# Patient Record
Sex: Male | Born: 1990 | Race: White | Hispanic: No | Marital: Single | Smoking: Never smoker
Health system: Southern US, Community
[De-identification: ages and names within clinical notes are randomized; demographics above are authoritative.]

---
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
## 2010-10-03 ENCOUNTER — Inpatient Hospital Stay (INDEPENDENT_AMBULATORY_CARE_PROVIDER_SITE_OTHER)
Admission: RE | Admit: 2010-10-03 | Discharge: 2010-10-03 | Disposition: A | Discharge status: Home / Self Care | Payer: Self-pay | Source: Ambulatory Visit | Attending: Family Medicine | Admitting: Family Medicine

## 2010-10-03 DIAGNOSIS — H9209 Otalgia, unspecified ear: Secondary | ICD-10-CM

## 2013-10-04 ENCOUNTER — Emergency Department (HOSPITAL_COMMUNITY): Payer: Self-pay

## 2013-10-04 ENCOUNTER — Emergency Department (HOSPITAL_COMMUNITY)
Admission: EM | Admit: 2013-10-04 | Discharge: 2013-10-04 | Disposition: A | Discharge status: Home / Self Care | Payer: Self-pay | Attending: Emergency Medicine | Admitting: Emergency Medicine

## 2013-10-04 ENCOUNTER — Encounter (HOSPITAL_COMMUNITY): Payer: Self-pay | Admitting: Emergency Medicine

## 2013-10-04 DIAGNOSIS — Y929 Unspecified place or not applicable: Secondary | ICD-10-CM | POA: Insufficient documentation

## 2013-10-04 DIAGNOSIS — X58XXXA Exposure to other specified factors, initial encounter: Secondary | ICD-10-CM | POA: Insufficient documentation

## 2013-10-04 DIAGNOSIS — Y9389 Activity, other specified: Secondary | ICD-10-CM | POA: Insufficient documentation

## 2013-10-04 DIAGNOSIS — S61209A Unspecified open wound of unspecified finger without damage to nail, initial encounter: Secondary | ICD-10-CM | POA: Insufficient documentation

## 2013-10-04 DIAGNOSIS — F17200 Nicotine dependence, unspecified, uncomplicated: Secondary | ICD-10-CM | POA: Insufficient documentation

## 2013-10-04 DIAGNOSIS — IMO0002 Reserved for concepts with insufficient information to code with codable children: Secondary | ICD-10-CM

## 2013-10-04 DIAGNOSIS — Z23 Encounter for immunization: Secondary | ICD-10-CM | POA: Insufficient documentation

## 2013-10-04 MED ORDER — HYDROCODONE-ACETAMINOPHEN 5-325 MG PO TABS: 1.0000 | ORAL_TABLET | Freq: Four times a day (QID) | ORAL | Status: DC | PRN

## 2013-10-04 MED ORDER — TETANUS-DIPHTH-ACELL PERTUSSIS 5-2.5-18.5 LF-MCG/0.5 IM SUSP
0.5000 mL | Freq: Once | INTRAMUSCULAR | Status: AC
  Administered 2013-10-04: 0.5 mL via INTRAMUSCULAR
  Filled 2013-10-04: qty 0.5

## 2013-10-04 MED ORDER — CEPHALEXIN 250 MG PO CAPS: 250.0000 mg | ORAL_CAPSULE | Freq: Four times a day (QID) | ORAL | Status: DC

## 2013-10-04 MED ORDER — ACETAMINOPHEN 325 MG PO TABS: 650.0000 mg | ORAL_TABLET | Freq: Once | ORAL | Status: DC

## 2013-10-04 NOTE — ED Provider Notes (Signed)
CSN: [id number]     Arrival date & time 10/04/13  1351 History  This chart was scribed for non-physician practitioner working with Shanna CiscoMegan E Docherty, MD, by Allen Foster, ED Scribe. This patient was seen in room [facility] and the patient's care was started at 5:52 PM.   First MD Initiated Contact with Patient 10/04/13 1733     Chief Complaint  Patient presents with  . Finger Injury    right thumb    The history is provided by the patient. No language interpreter was used.   HPI Comments: Allen Foster is a 23 y.o. male who presents to the Emergency Department complaining of a laceration to his right thumb which occurred today as he was slicing sheet metal. The site bled immediately, but the bleeding is controlled in the ED. Mr. 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 He denies paresthesia or weakness to the finger. The pt is unsure re the status of his tetanus.   History reviewed. No pertinent past medical history. History reviewed. No pertinent past surgical history. No family history on file. History  Substance Use Topics  . Smoking status: Current Every Day Smoker  . Smokeless tobacco: Not on file  . Alcohol Use: Yes    Review of Systems  Skin: Positive for wound.  Neurological: Positive for numbness. Negative for weakness.  All other systems reviewed and are negative.   Allergies  Review of patient's allergies indicates no known allergies.  Home Medications   Prior to Admission medications   Medication Sig Start Date End Date Taking? Authorizing Provider  Multiple Vitamin (MULTIVITAMIN WITH MINERALS) TABS tablet Take 1 tablet by mouth every morning.   Yes Historical Provider, MD   Triage Vitals: BP 137/96  Pulse 100  Temp(Src) 98.4 F (36.9 C) (Oral)  Resp 20  SpO2 100%  Physical Exam  Nursing note and vitals reviewed. Constitutional: He is oriented to  person, place, and time. He appears well-developed and well-nourished. No distress.  HENT:  Head: Normocephalic and atraumatic.  Eyes: Conjunctivae and EOM are normal.  Neck: Normal range of motion. Neck supple. No tracheal deviation present.  Cardiovascular: Normal rate, regular rhythm and normal heart sounds.  Exam reveals no friction rub.   No murmur heard. Pulmonary/Chest: Effort normal. No respiratory distress. He has no wheezes. He has no rales.  Musculoskeletal: Normal range of motion.       Right hand: He exhibits laceration. He exhibits normal range of motion, no tenderness, no bony tenderness, normal capillary refill, no deformity and no swelling. Normal sensation noted. Normal strength noted.       Hands: Full ROM to upper and lower extremities without difficulty noted, negative ataxia noted. Full flexion, extension, abduction, adduction noted to the right thumb.   Lymphadenopathy:    He has no cervical adenopathy.  Neurological: He is alert and oriented to person, place, and time. No cranial nerve deficit. He exhibits normal muscle tone. Coordination normal.  Cranial nerves III-XII grossly intact Strength 5+/5+ to upper and lower extremities bilaterally with resistance applied, equal distribution noted Strength intact to MCP, PIP, DIP joints of right hand Sensation intact with differentiation to sharp and dull touch   Skin: Skin is warm and dry.  Approximately 1 cm laceration identified to the flexor portion of the right thumb, palmar aspect. Bleeding controlled. Negative active drainage or bleeding noted.  Psychiatric: He has a  normal mood and affect. His behavior is normal.    ED Course  Procedures (including critical care time)  DIAGNOSTIC STUDIES: Oxygen Saturation is 100% on room air, normal by my interpretation.    COORDINATION OF CARE:  5:57 PM- Discussed treatment plan with patient, and the patient agreed to the plan. The plan includes imaging, a tetanus booster,  and sutures.   Labs Review Labs Reviewed - No data to display  Imaging Review Dg Finger Thumb Right  10/04/2013   CLINICAL DATA:  Laceration right thumb.  EXAM: RIGHT THUMB 2+V  COMPARISON:  None.  FINDINGS: Imaged bones, joints and soft tissues appear normal. No radiopaque foreign body is identified.  IMPRESSION: Negative exam.   Electronically Signed   By: Drusilla Kannerhomas  Dalessio M.D.   On: 10/04/2013 14:46   LACERATION REPAIR Performed by: Allen Foster, Allen Authorized by: Allen Foster, Allen Consent: Verbal consent obtained. Risks and benefits: risks, benefits and alternatives were discussed Consent given by: patient Patient identity confirmed: provided demographic data Prepped and Draped in normal sterile fashion Wound explored Laceration Location: right thumb, proximal flexor surface Laceration Length: 1cm No Foreign Bodies seen or palpated Anesthesia: local infiltration Local anesthetic: lidocaine 2 % without epinephrine Anesthetic total: 2 ml Irrigation method: syringe Amount of cleaning: standard Skin closure: approximate Number of sutures: 2 Technique: one single interrupted, one horizontal mattress Patient tolerance: Patient tolerated the procedure well with no immediate complications. Negative involvement of tendon and deep tendon. Hemostasis controlled.    EKG Interpretation None      MDM   Final diagnoses:  Laceration   Medications  Tdap (BOOSTRIX) injection 0.5 mL (0.5 mLs Intramuscular Given 10/04/13 1951)   Filed Vitals:   10/04/13 1357  BP: 137/96  Pulse: 100  Temp: 98.4 F (36.9 C)  TempSrc: Oral  Resp: 20  SpO2: 100%   I personally performed the services described in this documentation, which was scribed in my presence. The recorded information has been reviewed and is accurate.  Patient presenting to the ED with laceration to the right thumb flexor surface, measuring approximately 1 cm in length.  Negative focal neurological deficits noted. Full ROM to  the thumb noted. Sensation intact with differentiation to sharp and dull touch. Strength intact to the MCP, PIP and DIP joints of the right hand. Pulses palpable and strong.  Plain film of right thumb negative for acute osseous injury - no radio-opaque foreign bodies noted.  Wound thoroughly cleaned and explored with negative foreign bodies noted. Laceration repaired with 2 sutures - patient tolerated procedure well. Tetanus given.  Patient stable, afebrile. Patient not septic appearing. Discharged patient. Discussed with patient to rest and stay hydrated. Educated patient on proper hygiene. Discharge patient with pain medications - discussed course, precautions and disposal technique. Discharged with keflex. Discussed with patient that sutures need to be removed within 5-7 days. Discussed with patient to closely monitor symptoms and if symptoms are to worsen or change to report back to the ED - strict return instructions given.  Patient agreed to plan of care, understood, all questions answered.   Allen MuttonMarissa Sciacca, PA-C 10/05/13 1324

## 2013-10-04 NOTE — Discharge Instructions (Signed)
Please call and set-up an appointment with [facility] Please call and set-up an appointment with Hand Specialist, Dr. Merlyn Lot Please rest and stay hydrated Please wash wound with warm water and soap at least 3 times per day, pat dry, apply bacitracin, apply fresh gauze daily. If dressing becomes wet please apply new dressings Please take antibiotics as prescribed While on pain medications there is to be no drinking alcohol, driving, operating any heavy machinery. If there is extra please dispose in a proper manner. Please do not take any extra Tylenol along with it for this can lead to Tylenol and liver overdose.  Please wear gloves when working Sutures need to be removed within 5-7 days Please continue to monitor symptoms closely and if symptoms are to worsen or change (fever greater than 101, chills, sweating, nausea, vomiting, chest pain, shortness of breath, difficulty breathing, numbness, tingling, loss of sensation, bleeding, drainage, suture comes out, pus drainage, weakness, fall, injury) please report back to the ED immediately   Laceration Care, Adult A laceration is a cut or lesion that goes through all layers of the skin and into the tissue just beneath the skin. TREATMENT  Some lacerations may not require closure. Some lacerations may not be able to be closed due to an increased risk of infection. It is important to see your caregiver as soon as possible after an injury to minimize the risk of infection and maximize the opportunity for successful closure. If closure is appropriate, pain medicines may be given, if needed. The wound will be cleaned to help prevent infection. Your caregiver will use stitches (sutures), staples, wound glue (adhesive), or skin adhesive strips to repair the laceration. These tools bring the skin edges together to allow for faster healing and a better cosmetic outcome. However, all wounds will heal with a scar. Once the wound has healed, scarring  can be minimized by covering the wound with sunscreen during the day for 1 full year. HOME CARE INSTRUCTIONS  For sutures or staples:  Keep the wound clean and dry.  If you were given a bandage (dressing), you should change it at least once a day. Also, change the dressing if it becomes wet or dirty, or as directed by your caregiver.  Wash the wound with soap and water 2 times a day. Rinse the wound off with water to remove all soap. Pat the wound dry with a clean towel.  After cleaning, apply a thin layer of the antibiotic ointment as recommended by your caregiver. This will help prevent infection and keep the dressing from sticking.  You may shower as usual after the first 24 hours. Do not soak the wound in water until the sutures are removed.  Only take over-the-counter or prescription medicines for pain, discomfort, or fever as directed by your caregiver.  Get your sutures or staples removed as directed by your caregiver. For skin adhesive strips:  Keep the wound clean and dry.  Do not get the skin adhesive strips wet. You may bathe carefully, using caution to keep the wound dry.  If the wound gets wet, pat it dry with a clean towel.  Skin adhesive strips will fall off on their own. You may trim the strips as the wound heals. Do not remove skin adhesive strips that are still stuck to the wound. They will fall off in time. For wound adhesive:  You may briefly wet your wound in the shower or bath. Do not soak or scrub the wound. Do not  swim. Avoid periods of heavy perspiration until the skin adhesive has fallen off on its own. After showering or bathing, gently pat the wound dry with a clean towel.  Do not apply liquid medicine, cream medicine, or ointment medicine to your wound while the skin adhesive is in place. This may loosen the film before your wound is healed.  If a dressing is placed over the wound, be careful not to apply tape directly over the skin adhesive. This may  cause the adhesive to be pulled off before the wound is healed.  Avoid prolonged exposure to sunlight or tanning lamps while the skin adhesive is in place. Exposure to ultraviolet light in the first year will darken the scar.  The skin adhesive will usually remain in place for 5 to 10 days, then naturally fall off the skin. Do not pick at the adhesive film. You may need a tetanus shot if:  You cannot remember when you had your last tetanus shot.  You have never had a tetanus shot. If you get a tetanus shot, your arm may swell, get red, and feel warm to the touch. This is common and not a problem. If you need a tetanus shot and you choose not to have one, there is a rare chance of getting tetanus. Sickness from tetanus can be serious. SEEK MEDICAL CARE IF:   You have redness, swelling, or increasing pain in the wound.  You see a red line that goes away from the wound.  You have yellowish-white fluid (pus) coming from the wound.  You have a fever.  You notice a bad smell coming from the wound or dressing.  Your wound breaks open before or after sutures have been removed.  You notice something coming out of the wound such as wood or glass.  Your wound is on your hand or foot and you cannot move a finger or toe. SEEK IMMEDIATE MEDICAL CARE IF:   Your pain is not controlled with prescribed medicine.  You have severe swelling around the wound causing pain and numbness or a change in color in your arm, hand, leg, or foot.  Your wound splits open and starts bleeding.  You have worsening numbness, weakness, or loss of function of any joint around or beyond the wound.  You develop painful lumps near the wound or on the skin anywhere on your body. MAKE SURE YOU:   Understand these instructions.  Will watch your condition.  Will get help right away if you are not doing well or get worse. Document Released: 03/25/2005 Document Revised: 06/17/2011 Document Reviewed:  09/18/2010 [facility] Patient Information 2015 [place], [place]. This information is not intended to replace advice given to you by your health care provider. Make sure you discuss any questions you have with your health care provider.   Emergency Department Resource Guide 1) Find a Doctor and Pay Out of Pocket Although you won't have to find out who is covered by your insurance plan, it is a good idea to ask around and get recommendations. You will then need to call the office and see if the doctor you have chosen will accept you as a new patient and what types of options they offer for patients who are self-pay. Some doctors offer discounts or will set up payment plans for their patients who do not have insurance, but you will need to ask so you aren't surprised when you get to your appointment.  2) Contact Your Local Health Department Not all health departments  have doctors that can see patients for sick visits, but many do, so it is worth a call to see if yours does. If you don't know where your local health department is, you can check in your phone book. The CDC also has a tool to help you locate your state's health department, and many state websites also have listings of all of their local health departments.  3) Find a Walk-in Clinic If your illness is not likely to be very severe or complicated, you may want to try a walk in clinic. These are popping up all over the country in pharmacies, drugstores, and shopping centers. They're usually staffed by nurse practitioners or physician assistants that have been trained to treat common illnesses and complaints. They're usually fairly quick and inexpensive. However, if you have serious medical issues or chronic medical problems, these are probably not your best option.  No Primary Care Doctor: - Call Health Connect at  [phone] - they can help you locate a primary care doctor that  accepts your insurance, provides certain services, etc. - Physician  Referral Service- [phone]  Chronic Pain Problems: Organization         Address  Phone   Notes  [facility]  [phone] Patients need to be referred by their primary care doctor.   Medication Assistance: Organization         Address  Phone   Notes  [facility] Medication [facility] [address] [place], [place] [address] [phone] --Must be a resident of [facility] -- Must have NO insurance coverage whatsoever (no Medicaid/ Medicare, etc.) -- The pt. MUST have a primary care doctor that directs their care regularly and follows them in the community   [facility]  [phone]   [employer]  [phone]    Agencies that provide inexpensive medical care: Organization         Address  Phone   Notes  [facility]  [phone]   [facility]    [phone]   [facility] [address] [place], [place] [address] [phone]   [facility] [address], [place] [phone]   Planned Parenthood    [phone]   [facility]    [phone]   [facility] and [facility]  [address], [place] Phone:  [phone], Fax:  [phone] Hours of Operation:  9 am - 6 pm, M-F.  Also accepts Medicaid/Medicare and self-pay.  [facility]  [address], [place] Phone: [phone], Fax: [phone]. Hours of Operation:  8:30 am - 5:30 pm, M-F.  Also accepts Medicaid and self-pay.  [facility] [place] [address] [place] Phone: [phone]   [facility] [address] [place], [place] [phone], Ext. [phone] Mondays & Thursdays: 7-9 AM.  First 15 patients are seen on a first come, first serve basis.    Medicaid-accepting [facility] Providers:  Organization         Address  Phone   Notes  [facility] [address], [place] [phone] Also accepts self-pay patients.  [facility] [address] Laurell Josephs [place], [place]  [phone]   [facility] [address] [place] [address], [place] [phone]   [facility]  [address] [place] [phone]   Renaye Rakers [address], [place]   [phone] Only accepts [place] patients after they have their name applied to their card.   Self-Pay (no insurance) in [facility]:  Organization         Address  Phone   Notes  Sickle Cell Patients, [facility] [address] [place], [place] [phone]   [facility] [address] [place], [place] [phone]   [facility] [place]  [address], [place] [phone]   [facility]/Dr. Osei-Bonsu  [address], [place] or [address], [place] [phone] Phone number for both [place] and [place] locations is the same.  [facility]gent [facility] and [facility] [address], [place] [phone]   [facility] [address], [place] or [address] [phone]   [facility] [address] [place] [phone], phone; [phone], fax Sees patients 1st and 3rd Saturday of every month.  Must not qualify for public or private insurance (i.e. Medicaid, Medicare, [place] Health Choice, Veterans' Benefits)  Household income should be no more than 200% of the poverty level The clinic cannot treat you if you are pregnant or think you are pregnant  Sexually transmitted diseases are not treated at the clinic.    Dental Care: Organization         Address  Phone  Notes  [facility] of [facility] [address] [place], [place] [phone] Accepts children up to age 14 who are enrolled  in [place] or [place] Health Choice; pregnant women with a Medicaid card; and children who have applied for Medicaid or [place] Health Choice, but were declined, whose parents can pay a reduced fee at time of service.  [facility]  [address], [place] [phone] Accepts children up to age 62 who are enrolled in [place] or [place] Health Choice; pregnant women with a Medicaid card; and children who have applied for Medicaid or [place] Health Choice, but were declined, whose parents can pay a reduced fee at time of service.  [facility]  [address] [place], [place] [phone] Patients are seen by appointment only. Walk-ins are not accepted. [facility] will see patients 73 years of age and older. Monday - Tuesday (8am-5pm) Most Wednesdays (8:30-5pm) $30 per visit, cash only  [facility] Adult Dental Access [facility]  [address], [facility] [phone] Patients are seen by appointment only. Walk-ins are not accepted. [facility] will see patients 17 years of age and older. One Wednesday Evening (Monthly: Volunteer Based).  $30 per visit, cash only  [employer]  [phone] for adults; Children under age 2, call Graduate Pediatric Dentistry at [phone]. Children aged 25-14, please call [phone] to request a pediatric application.  Dental services are provided in all areas of dental care including fillings, crowns and bridges, complete and partial dentures, implants, gum treatment, root canals, and extractions. Preventive care is also provided. Treatment is provided to both adults and children. Patients are selected via a lottery and there is often a waiting list.   [facility] [address], [place]  [phone] [email or web address]   [facility] [phone]  [address], [place], [place] [phone], Ext. [phone] Second and Fourth Thursday of each month, opens at  6:30 AM; Clinic ends at 9 AM.  Patients are seen on a first-come first-served basis, and a limited number are seen during each clinic.   [facility]  [address], [place], [place] [phone]   Eligibility Requirements You must have lived in [place], [place], or [place] counties for at least the last three months.   You cannot be eligible for state or federal sponsored [place]healthcare insurance, including CIG[employer]eterans Administration, [place]icaid, or [employer]icare.   You generally cannot be eligible for healthcare insurance through your employer.    How to apply: Eligibility screenings are held every Tuesday and Wednesday afternoon from 1:00 pm until 4:00 pm. You do not need an appointment for the interview!  [facility] [address], [place], [place] [phone]   [facility]  [phone]   [facility]  [phone]   [facility]  [phone]    Behavioral Health Resources in the Community: Intensive Outpatient Programs Organization         Address  Phone  Notes  [facility] [address], [place], [place] [phone]   [facility] [address], [place], [place] [phone]   [facility] [address], [place], [place]  [phone]   [facility] [address],  [place], [place] [phone] or [phone]   Substance Abuse Resources Organization         Address  Phone  Notes  [facility]  [phone]   [facility]  [phone]   [facility]  [phone]   [facility]  [phone]   Residential & Outpatient Substance Abuse Program  [phone]   Psychological Services Organization         Address  Phone  Notes  [facility]  [phone]- [phone]   [facility]  [phone]- [phone]   [facility] [address], [place]  [phone] or [phone]    Mobile Crisis Teams Organization         Address  Phone  Notes  [facility], Mobile Crisis Care Unit  [phone]   [facility]  [address] [place], [place] [phone]   Doristine LocksSharon DeEsch [address] [place] [phone]    Self-Help/Support Groups Organization         Address  Phone             Notes  [facility] [place] - variety of support groups  [phone]- [phone] Call for more information  Narcotics Anonymous (NA), [facility] [address], [place]  2 meetings at this location   Statisticianesidential Treatment Programs Organization         Address  Phone  Notes  [facility] [address],    [place]  [phone]   [facility]  [address], [place] [phone], [place], [place] [phone]   [facility] [address], [place] [phone] Admissions: 8am-3pm M-F  [facility] [address]    [place], [place] [phone]   [facility] [address], [place], [place] [phone]   [facility] [address]  [place], [place] [phone]   [facility] - Intensive Outpatient [phone], [place],  [place] [phone]   [facility] ([facility] [address]  [place], [place] [phone] or [phone]   [facility] ([facility]) [address] [place], [place] [phone] Accepts Medicaid  [facility] [address]  [place] [phone] Substance Abuse/Addiction Treatment   [facility] Resources Organization         Address  Phone  Notes  [facility]  [phone]   Angie FavaJulie Brannon, PhD [address], [address] [place], [place]   [phone] or [phone]) [phone]   [facility]   [address] [place], [place] [phone]   [facility]  [address], [place], [place] [phone] Insurance/Medicaid/sponsorship through [facility]  [facility] [address]                                    [place], [place] [phone] Therapy/tele-psych/case  [facility] [address]   [place], [place] [phone]    Dr. Lolly MustacheArfeen  [phone]   [facility] [place]  [employer] [facility] 1) [address],  2) [address], [place] 3)  [address], [place] [phone]) [phone]   [facility] Child Abuse Hotline [phone]) [phone] or [phone] (After Hours)

## 2013-10-04 NOTE — ED Notes (Signed)
Pt cut lower inside part of right thumb with sheet metal, bleeding controlled.

## 2013-10-05 NOTE — ED Provider Notes (Signed)
Medical screening examination/treatment/procedure(s) were performed by non-physician practitioner and as supervising physician I was immediately available for consultation/collaboration.   Megan E Docherty, MD 10/05/13 2354 

## 2015-08-11 IMAGING — CR DG FINGER THUMB 2+V*R*
3 series · 3 of 3 positions shown · non-contrast
Comparison: None.

CLINICAL DATA: Laceration right thumb.

EXAM:
RIGHT THUMB 2+V

[x finger pa right]
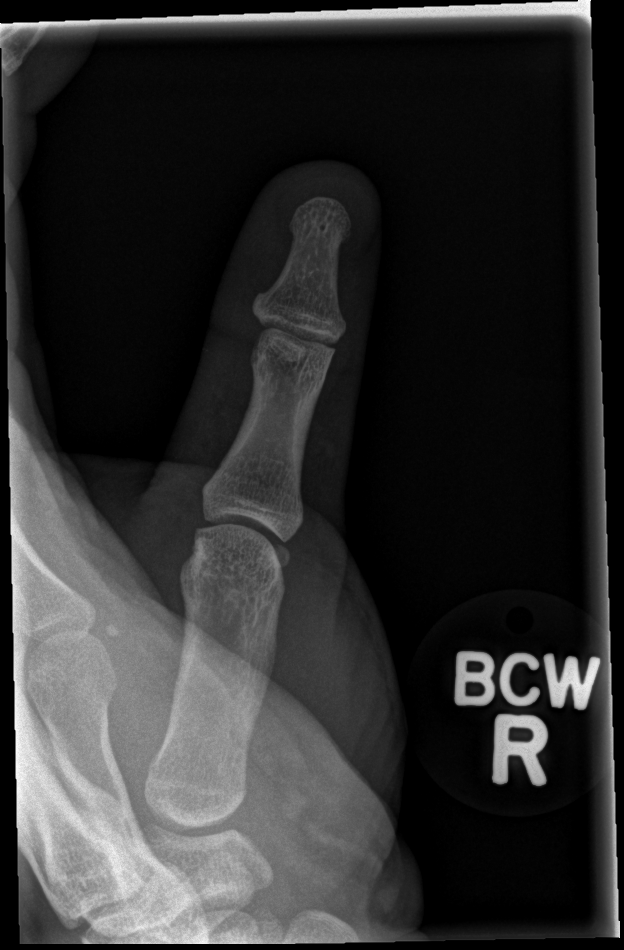

[x finger obl right]
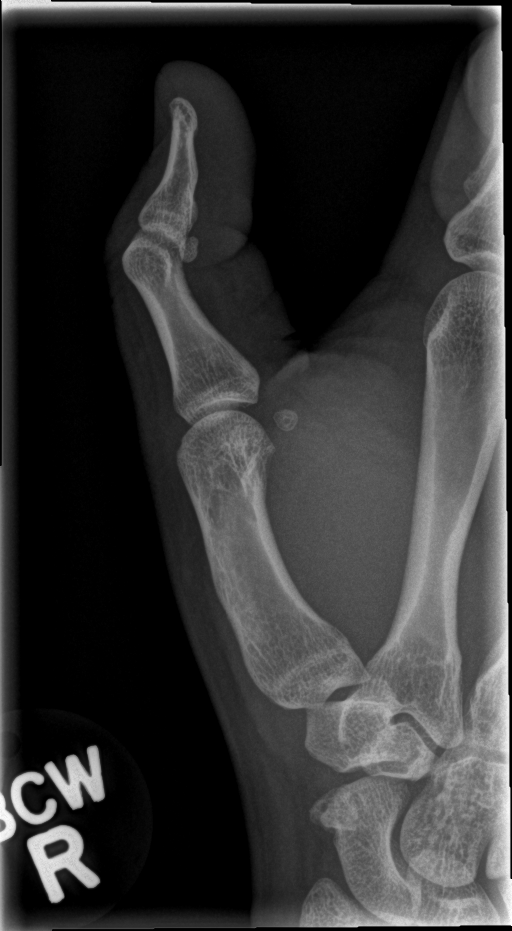

[x finger lat right]
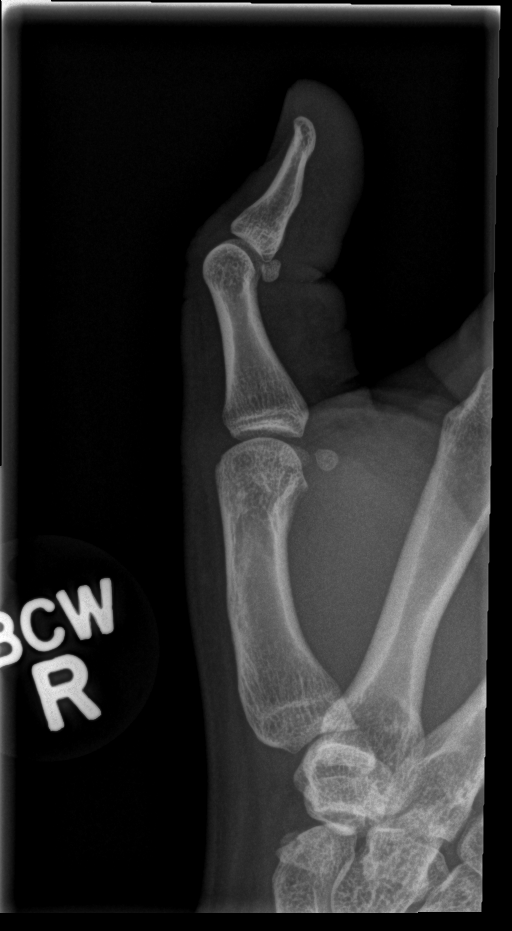

[3 of 3 positions shown; findings below may reference images not displayed]

FINDINGS: Imaged bones, joints and soft tissues appear normal. No radiopaque
foreign body is identified.
IMPRESSION: Negative exam.

## 2024-04-28 ENCOUNTER — Encounter (HOSPITAL_COMMUNITY): Payer: Self-pay

## 2024-04-28 ENCOUNTER — Ambulatory Visit (HOSPITAL_COMMUNITY)
Admission: EM | Admit: 2024-04-28 | Discharge: 2024-04-28 | Disposition: A | Discharge status: Home / Self Care | Payer: Self-pay | Attending: Nurse Practitioner | Admitting: Nurse Practitioner

## 2024-04-28 DIAGNOSIS — J069 Acute upper respiratory infection, unspecified: Secondary | ICD-10-CM

## 2024-04-28 MED ORDER — BENZONATATE 100 MG PO CAPS: 100.0000 mg | ORAL_CAPSULE | Freq: Three times a day (TID) | ORAL | 0 refills | Status: AC | PRN

## 2024-04-28 MED ORDER — GUAIFENESIN 100 MG/5ML PO LIQD: 100.0000 mg | ORAL | 0 refills | Status: AC | PRN

## 2024-04-28 NOTE — ED Provider Notes (Signed)
 " [facility]    CSN: [id number] Arrival date & time: 04/28/24  1428      History   Chief Complaint Chief Complaint  Patient presents with   Cough   Hoarse    HPI Allen Foster is a 34 y.o. male.   Patient presents today with 3-day history of bodyaches, chills, congested cough, sinus pressure only in the morning, decreased appetite, and fatigue.  He denies known fevers, shortness of breath or chest pain, runny or stuffy nose, postnasal drainage, sore throat, headache, ear pain, abdominal pain, nausea, and diarrhea.  Reports he has taken Mucinex  DM and it has made him throw up a couple of times.  No known sick contacts.  Has tried Mucinex  DM and TheraFlu both which made him vomit.  Reports since vomiting, he noticed a bump under his tongue.  It is not painful or draining anything.  Denies history of similar.    History reviewed. No pertinent past medical history.  There are no active problems to display for this patient.   History reviewed. No pertinent surgical history.     Home Medications    Prior to Admission medications  Medication Sig Start Date End Date Taking? Authorizing Provider  benzonatate  (TESSALON ) 100 MG capsule Take 1-2 capsules (100-200 mg total) by mouth 3 (three) times daily as needed for cough. Do not take with alcohol or while operating or driving heavy machinery 8/78/73  Yes Chandra Harlene LABOR, NP  guaiFENesin  (ROBITUSSIN) 100 MG/5ML liquid Take 5-10 mLs (100-200 mg total) by mouth every 4 (four) hours as needed for cough or to loosen phlegm. 04/28/24  Yes Chandra Harlene LABOR, NP  Multiple Vitamin (MULTIVITAMIN WITH MINERALS) TABS tablet Take 1 tablet by mouth every morning.    [provider]    Family History Family History  Problem Relation Age of Onset   Cancer Mother    Thyroid disease Father     Social History Social History[1]   Allergies   Patient has no known allergies.   Review of Systems Review of  Systems Per HPI  Physical Exam Triage Vital Signs ED Triage Vitals  Encounter Vitals Group     BP 04/28/24 1553 124/88     Girls Systolic BP Percentile --      Girls Diastolic BP Percentile --      Boys Systolic BP Percentile --      Boys Diastolic BP Percentile --      Pulse Rate 04/28/24 1553 89     Resp 04/28/24 1553 16     Temp 04/28/24 1553 98.7 F (37.1 C)     Temp Source 04/28/24 1553 Oral     SpO2 04/28/24 1553 97 %     Weight --      Height --      Head Circumference --      Peak Flow --      Pain Score 04/28/24 1556 0     Pain Loc --      Pain Education --      Exclude from Growth Chart --    No data found.  Updated Vital Signs BP 124/88 (BP Location: Right Arm)   Pulse 89   Temp 98.7 F (37.1 C) (Oral)   Resp 16   SpO2 97%   Visual Acuity Right Eye Distance:   Left Eye Distance:   Bilateral Distance:    Right Eye Near:   Left Eye Near:    Bilateral Near:  Physical Exam Vitals and nursing note reviewed.  Constitutional:      General: He is not in acute distress.    Appearance: Normal appearance. He is not ill-appearing or toxic-appearing.  HENT:     Head: Normocephalic and atraumatic.     Right Ear: Tympanic membrane, ear canal and external ear normal.     Left Ear: Tympanic membrane, ear canal and external ear normal.     Nose: No congestion or rhinorrhea.     Mouth/Throat:     Mouth: Mucous membranes are moist.     Pharynx: Oropharynx is clear. Postnasal drip present. No oropharyngeal exudate or posterior oropharyngeal erythema.     Comments: Hypopigmented, fluid-filled blister noted under tongue along the frenulum.  No pain with palpation. Eyes:     General: No scleral icterus.    Extraocular Movements: Extraocular movements intact.  Cardiovascular:     Rate and Rhythm: Normal rate and regular rhythm.  Pulmonary:     Effort: Pulmonary effort is normal. No respiratory distress.     Breath sounds: Normal breath sounds. No wheezing,  rhonchi or rales.  Musculoskeletal:     Cervical back: Normal range of motion and neck supple.  Lymphadenopathy:     Cervical: No cervical adenopathy.  Skin:    General: Skin is warm and dry.     Coloration: Skin is not jaundiced or pale.     Findings: No erythema or rash.  Neurological:     Mental Status: He is alert and oriented to person, place, and time.  Psychiatric:        Behavior: Behavior is cooperative.      UC Treatments / Results  Labs (all labs ordered are listed, but only abnormal results are displayed) Labs Reviewed - No data to display  EKG   Radiology No results found.  Procedures Procedures (including critical care time)  Medications Ordered in UC Medications - No data to display  Initial Impression / Assessment and Plan / UC Course  I have reviewed the triage vital signs and the nursing notes.  Pertinent labs & imaging results that were available during my care of the patient were reviewed by me and considered in my medical decision making (see chart for details).   Patient is a pleasant, well-appearing 34 year old male presenting today for cough, hoarseness, congestion.  Vital signs are stable and  exam is reassuring.  Viral testing deferred given length of symptoms.  Suspect viral etiology.  Supportive care discussed.  Suspect aphthous ulcer, recommended salt water rinses.  Start cough suppressant medication and plain guaifenesin .  ER and return precautions discussed.  Work excuse provided.  The patient was given the opportunity to ask questions.  All questions answered to their satisfaction.  The patient is in agreement to this plan.   Final Clinical Impressions(s) / UC Diagnoses   Final diagnoses:  Viral URI with cough     Discharge Instructions      You have a viral upper respiratory infection.  Symptoms should improve over the next week to 10 days.  If you develop chest pain or shortness of breath, go to the emergency room.  Some things  that can make you feel better are: - Increased rest - Increasing fluid with water/sugar free electrolytes - Acetaminophen  and ibuprofen as needed for fever/pain - Salt water gargling, chloraseptic spray and throat lozenges for sore throat - OTC guaifenesin  (Mucinex ) 600 mg twice daily for congestion - Saline sinus flushes or a neti pot - Humidifying  the air -Tessalon  Perles every 8 hours as needed for dry cough      ED Prescriptions     Medication Sig Dispense Auth. Provider   benzonatate  (TESSALON ) 100 MG capsule Take 1-2 capsules (100-200 mg total) by mouth 3 (three) times daily as needed for cough. Do not take with alcohol or while operating or driving heavy machinery 30 capsule Chandra Raisin A, NP   guaiFENesin  (ROBITUSSIN) 100 MG/5ML liquid Take 5-10 mLs (100-200 mg total) by mouth every 4 (four) hours as needed for cough or to loosen phlegm. 236 mL Chandra Raisin LABOR, NP      PDMP not reviewed this encounter.     [1]  Social History Tobacco Use   Smoking status: Never   Smokeless tobacco: Never  Vaping Use   Vaping status: Never Used  Substance Use Topics   Alcohol use: Yes   Drug use: Yes    Types: Marijuana     Chandra Raisin LABOR, NP 04/28/24 1653  "

## 2024-04-28 NOTE — ED Triage Notes (Addendum)
 Patient reports that he has had a productive cough with cream colored sputum. Patient states he had been taking Mucinex  DM which caused him to have acid reflux and states he woke up twice vomiting.  Patient states he has a sore or white patch under his tongue and is worried about it. States it came about after coughing .  Patient states he also needs a work note.

## 2024-04-28 NOTE — Discharge Instructions (Signed)
You have a viral upper respiratory infection.  Symptoms should improve over the next week to 10 days.  If you develop chest pain or shortness of breath, go to the emergency room.  Some things that can make you feel better are: - Increased rest - Increasing fluid with water/sugar free electrolytes - Acetaminophen and ibuprofen as needed for fever/pain - Salt water gargling, chloraseptic spray and throat lozenges for sore throat - OTC guaifenesin (Mucinex) 600 mg twice daily for congestion - Saline sinus flushes or a neti pot - Humidifying the air -Tessalon Perles every 8 hours as needed for dry cough
